# Patient Record
Sex: Male | Born: 1975 | Race: White | Hispanic: No | Marital: Married | Smoking: Never smoker
Health system: Southern US, Academic
[De-identification: ages and names within clinical notes are randomized; demographics above are authoritative.]

## PROBLEM LIST (undated history)

## (undated) DIAGNOSIS — Z8659 Personal history of other mental and behavioral disorders: Secondary | ICD-10-CM

## (undated) DIAGNOSIS — E079 Disorder of thyroid, unspecified: Secondary | ICD-10-CM

## (undated) DIAGNOSIS — F411 Generalized anxiety disorder: Secondary | ICD-10-CM

## (undated) DIAGNOSIS — G4700 Insomnia, unspecified: Secondary | ICD-10-CM

## (undated) DIAGNOSIS — G40909 Epilepsy, unspecified, not intractable, without status epilepticus: Secondary | ICD-10-CM

## (undated) HISTORY — PX: HX APPENDECTOMY: SHX54

## (undated) HISTORY — PX: HX TONSILLECTOMY: SHX27

## (undated) HISTORY — PX: HX ADENOIDECTOMY: SHX29

---
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
## 2021-09-03 ENCOUNTER — Other Ambulatory Visit: Payer: Self-pay

## 2021-09-03 ENCOUNTER — Encounter (HOSPITAL_BASED_OUTPATIENT_CLINIC_OR_DEPARTMENT_OTHER): Payer: Self-pay

## 2021-09-03 ENCOUNTER — Emergency Department
Admission: EM | Admit: 2021-09-03 | Discharge: 2021-09-03 | Disposition: A | Discharge status: Home / Self Care | Payer: 59 | Attending: Family | Admitting: Family

## 2021-09-03 DIAGNOSIS — B370 Candidal stomatitis: Secondary | ICD-10-CM

## 2021-09-03 DIAGNOSIS — B379 Candidiasis, unspecified: Secondary | ICD-10-CM

## 2021-09-03 HISTORY — DX: Epilepsy, unspecified, not intractable, without status epilepticus: G40.909

## 2021-09-03 HISTORY — DX: Personal history of other mental and behavioral disorders: Z86.59

## 2021-09-03 MED ORDER — NYSTATIN 100,000 UNIT/ML ORAL SUSPENSION
5.0000 mL | Freq: Four times a day (QID) | ORAL | 0 refills | Status: AC
Start: 2021-09-03 — End: 2021-09-13

## 2021-09-03 MED ORDER — FLUCONAZOLE 100 MG TABLET
200.0000 mg | ORAL_TABLET | ORAL | Status: AC
Start: 2021-09-03 — End: 2021-09-03
  Administered 2021-09-03: 200 mg via ORAL

## 2021-09-03 MED ORDER — NYSTATIN 100,000 UNIT/ML ORAL SUSPENSION
ORAL | Status: AC
Start: 2021-09-03 — End: 2021-09-03
  Filled 2021-09-03: qty 5

## 2021-09-03 MED ORDER — NYSTATIN 100,000 UNIT/ML ORAL SUSPENSION
5.0000 mL | Freq: Four times a day (QID) | ORAL | Status: DC
Start: 2021-09-03 — End: 2021-09-03
  Administered 2021-09-03: 5 mL via ORAL

## 2021-09-03 MED ORDER — FLUCONAZOLE 100 MG TABLET
ORAL_TABLET | ORAL | Status: AC
Start: 2021-09-03 — End: 2021-09-03
  Filled 2021-09-03: qty 2

## 2021-09-03 NOTE — ED Provider Notes (Signed)
[facility], [facility] Emergency Department  ED Primary Provider Note  History of Present Illness   Chief Complaint   Patient presents with   . Mouth Pain     Arrival: The patient arrived by Car    Russell Silva is a 46 y.o. male who had concerns including Mouth Pain. had bronchitis last week. Inhaler, steroid, antibiotics, now has thrush. Denies diff swallowing.     Review of Systems   Constitutional: No fever, chills or weakness   Skin: No rash or diaphoresis  HENT: No headaches, or congestion, oral white patches.   Eyes: No vision changes or photophobia   Cardio: No chest pain, palpitations or leg swelling   Respiratory: No cough, wheezing or SOB  GI:  No nausea, vomiting or stool changes  GU:  No dysuria, hematuria, or increased frequency  MSK: No muscle aches, joint or back pain  Neuro: No seizures, LOC, numbness, tingling, or focal weakness  Psychiatric: No depression, SI or substance abuse  All other systems reviewed and are negative.    Historical Data   History Reviewed This Encounter: all noted and reviewed.     Physical Exam   ED Triage Vitals [09/03/21 1711]   BP (Non-Invasive) (!) 143/95   Heart Rate 79   Respiratory Rate 16   Temp    SpO2 99 %   Weight 104 kg (230 lb)   Height 1.727 m (5\' 8" )       Constitutional:  46 y.o. male who appears in no distress. Normal color, no cyanosis.   HENT:   Head: Normocephalic and atraumatic.   Mouth/Throat: Oropharynx is clear and moist.  White patches. pharyns tongue.   Eyes: EOMI, PERRL   Neck: Trachea midline. Neck supple.  Cardiovascular: RRR, No murmurs, rubs or gallops. Intact distal pulses.  Pulmonary/Chest: BS equal bilaterally. No respiratory distress. No wheezes, rales or chest tenderness.   Abdominal: Bowel sounds present and normal. Abdomen soft, no tenderness, no rebound and no guarding.  Back: No midline spinal tenderness, no paraspinal tenderness, no CVA tenderness.           Musculoskeletal: No edema, tenderness or  deformity.  Skin: warm and dry. No rash, erythema, pallor or cyanosis  Psychiatric: normal mood and affect. Behavior is normal.   Neurological: Patient keenly alert and responsive, easily able to raise eyebrows, facial muscles/expressions symmetric, speaking in fluent sentences, moving all extremities equally and fully, normal gait  Patient Data   Labs Ordered/Reviewed - No data to display  No orders to display     Medical Decision Making        MDM     Thrush. Leukoplakia. strep  Medications Administered in the ED   fluconazole (DIFLUCAN) tablet (has no administration in time range)   nystatin (MYCOSTATIN) 100,000 units per mL oral liquid (has no administration in time range)     Clinical Impression   Oral candidiasis (Primary)       Disposition: Discharged

## 2021-09-03 NOTE — ED Triage Notes (Signed)
Patient presents with pain and white spots throughout mouth. x7 days. Following use of inhalers.

## 2022-08-02 ENCOUNTER — Encounter (HOSPITAL_BASED_OUTPATIENT_CLINIC_OR_DEPARTMENT_OTHER): Payer: Self-pay

## 2022-08-02 ENCOUNTER — Other Ambulatory Visit: Payer: Self-pay

## 2022-08-02 ENCOUNTER — Emergency Department
Admission: EM | Admit: 2022-08-02 | Discharge: 2022-08-02 | Disposition: A | Discharge status: Home / Self Care | Payer: Commercial Managed Care - PPO | Attending: Emergency Medicine | Admitting: Emergency Medicine

## 2022-08-02 DIAGNOSIS — K068 Other specified disorders of gingiva and edentulous alveolar ridge: Secondary | ICD-10-CM | POA: Insufficient documentation

## 2022-08-02 DIAGNOSIS — K08409 Partial loss of teeth, unspecified cause, unspecified class: Secondary | ICD-10-CM

## 2022-08-02 DIAGNOSIS — Z98818 Other dental procedure status: Secondary | ICD-10-CM | POA: Insufficient documentation

## 2022-08-02 MED ORDER — LIDOCAINE 20 MG/ML (2 %)-EPINEPHRINE 1:100,000 INJECTION SOLUTION
INTRAMUSCULAR | Status: AC
Start: 2022-08-02 — End: 2022-08-02
  Filled 2022-08-02: qty 30

## 2022-08-02 MED ORDER — TRANEXAMIC ACID TOPICAL SOLUTION
10.0000 mL | CUTANEOUS | Status: AC
Start: 2022-08-02 — End: 2022-08-02
  Administered 2022-08-02: 10 mL via TOPICAL

## 2022-08-02 MED ORDER — LIDOCAINE 20 MG/ML (2 %)-EPINEPHRINE 1:100,000 INJECTION SOLUTION
1.0000 mL | Freq: Once | INTRAMUSCULAR | Status: AC
Start: 2022-08-02 — End: 2022-08-02
  Administered 2022-08-02: 20 mg via INTRADERMAL

## 2022-08-02 MED ORDER — TRANEXAMIC ACID 1,000 MG/10 ML (100 MG/ML) INTRAVENOUS SOLUTION
INTRAVENOUS | Status: AC
Start: 2022-08-02 — End: 2022-08-02
  Filled 2022-08-02: qty 10

## 2022-08-02 MED ORDER — LIDOCAINE 2 %-EPINEPHRINE BITARTRATE 1:100,000 INJECTION CARTRIDGE
0.3000 mL | CARTRIDGE | Freq: Once | INTRAMUSCULAR | Status: DC
Start: 2022-08-02 — End: 2022-08-02

## 2022-08-02 NOTE — ED Triage Notes (Signed)
Pt had 4 teeth extracted yesterday, bleeding gums, nasal congestion

## 2022-08-02 NOTE — ED Provider Notes (Signed)
[facility], [facility] Emergency Department  ED Primary Provider Note  HPI:  Russell Silva is a 47 y.o. male     Patient complains of bleeding from gums.  He had 4 teeth extracted yesterday.  He was initially hemostatic after but he has had slow and persistent bleeding since.  No difficulty swallowing no trouble breathing.  He denies trauma.  No fevers no chills no nausea no vomiting.  He has not been able to reach his a dentist.  COVID vaccinated.  Full code.  ROS review and negative aside from stated in HPI.    Physical Exam:  ED Triage Vitals [08/02/22 0257]   BP (Non-Invasive) 124/83   Heart Rate 83   Respiratory Rate 17   Temperature 36.3 C (97.3 F)   SpO2 98 %   Weight 99.8 kg (220 lb)   Height 1.727 m (5' 8"$ )     No acute distress.  Patient awake alert oriented x3.  Mood is appropriate.  Pupils 3 mm equal round reactive.  Extraocular movements are intact.  Oropharynx is bloody.  He was controlling secretions.  Phonating appropriately.  He was a dentate.  Reading from site where tooth number 24 twenty-five used to reside.  There is no swelling.    Trachea midline.  Neck is supple.  Heart has regular rate and rhythm without significant murmurs rubs or gallops.  Lungs are clear to auscultation.  Abdomen soft nontender, nondistended.  Moving all extremities without difficulty.  No rash no edema.      Patient data:  Labs Ordered/Reviewed - No data to display  No orders to display     Regional Nerve Block    Date/Time: 08/02/2022 4:02 AM    Performed by: Eustace Quail, MD  Authorized by: Eustace Quail, MD    Consent:     Consent obtained:  Verbal    Consent given by:  Patient    Risks, benefits, and alternatives were discussed: yes      Risks discussed:  Infection, intravenous injection, bleeding, allergic reaction, nerve damage and pain    Alternatives discussed:  No treatment  Universal protocol:     Patient identity confirmed:  Verbally with patient and arm  band  Indications:     Indications:  Pain relief  Location:     Body area:  Head (Bilateral submental)    Laterality:  Bilateral  Pre-procedure details:     Preparation: Patient was prepped and draped in usual sterile fashion    Procedure details:     Block needle gauge:  25 G    Anesthetic injected:  Lidocaine 2% WITH epi    Injection procedure:  Anatomic landmarks identified  Post-procedure details:     Dressing:  None    Procedure completion:  Tolerated  Comments:      Patient received bilateral submental nerve blocks to achieve sufficient analgesia.      MDM:  Bleeding from gumline following dental extraction.  There is no signs of active infection.  Patient is protecting airway.  Bleeding was controlled with a combination of direct injection lidocaine with epi and direct pressure with TXA soaked gauze.  Patient tolerated procedure well.  He feels better on re-evaluation.  I have asked him to follow closely with his dentist.  I provided anticipatory guidance and strict return to ED instructions in both a verbal and written manner.  He was comfortable with discharge.        Discharged  Clinical Impression   S/P tooth extraction   Bleeding gums (Primary)     Medications Administered in the ED   tranexamic acid (CYKLOKAPRON) 100 mg/mL topical solution (10 mL Apply Topically Given 08/02/22 0353)   lidocaine 2%-EPINEPHrine 1:100,000 injection (20 mg Intradermal Given 08/02/22 0335)        Current Discharge Medication List        CONTINUE these medications - NO CHANGES were made during your visit.        Details   calcium citrate-vitamin D3 200 mg-6.25 mcg (250 unit) Tablet  Commonly known as: CITRACAL   Oral, DAILY  Refills: 0     levETIRAcetam 1,000 mg Tablet  Commonly known as: KEPPRA   1,000 mg, Oral, 2 TIMES DAILY  Refills: 0     LORazepam 0.5 mg Tablet  Commonly known as: ATIVAN   0.5 mg, Oral, DAILY PRN  Refills: 0     Lovastatin 10 mg Tablet  Commonly known as: MEVACOR   10 mg, Oral, EVERY EVENING, Take with  evening meal  Refills: 0     pantoprazole 20 mg Tablet, Delayed Release (E.C.)  Commonly known as: PROTONIX   20 mg, Oral, EVERY MORNING BEFORE BREAKFAST  Refills: 0     traZODone 50 mg Tablet  Commonly known as: DESYREL   50 mg, Oral, NIGHTLY  Refills: 0     vitamin B complex Tablet   1 Tablet, Oral, DAILY  Refills: 0

## 2022-08-02 NOTE — Discharge Instructions (Signed)
Please follow up with your dentist tomorrow.  If you have her rebleed please applied direct pressure with a single finger for 30 minutes.  If bleeding continues return to emergency department.  Return to emergency department if you have worsening pain swelling or any other concerning symptoms.  Thank you for visiting [facility]

## 2022-08-02 NOTE — ED Nurses Note (Addendum)
Hemostasis to left lower gum at this time,. MD at bedside.

## 2022-08-02 NOTE — ED Nurses Note (Signed)
Patient discharged home with family.  AVS reviewed with patient/care giver.  A written copy of the AVS and discharge instructions was given to the patient/care giver.  Questions sufficiently answered as needed.  Patient/care giver encouraged to follow up with PCP as indicated.  In the event of an emergency, patient/care giver instructed to call 911 or go to the nearest emergency room.

## 2022-10-12 IMAGING — MR MRI CERVICAL SPINE WITHOUT CONTRAST
4 of 5 series · 24 of 48 positions shown · IV contrast (gadolinium)
Comparison: None available.

﻿EXAM:  86474   MRI CERVICAL SPINE WITHOUT CONTRAST
INDICATION: 46-year-old sustained trauma at work one week ago.  Pain in the left arm.
TECHNIQUE: Multiplanar multisequential MRI of the cervical spine was performed without gadolinium contrast.

[Series 5: T2 · sagittal · 3.0mm · 0.75mm/px · 8 of 13 slices shown (1 of 2)]
[im 1/13]
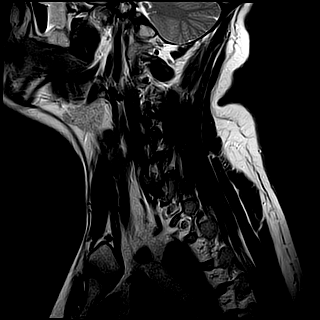
[im 2/13]
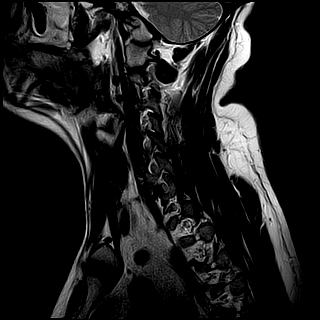
[im 4/13]
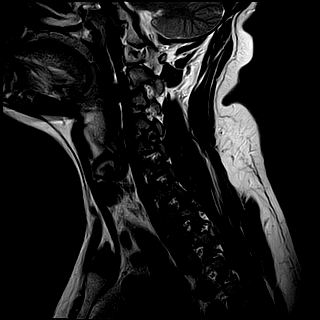
[im 6/13]
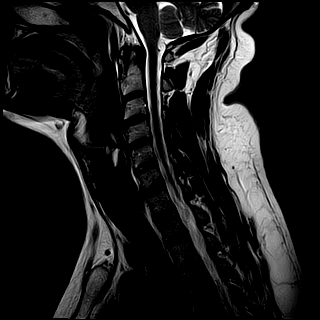
[im 7/13]
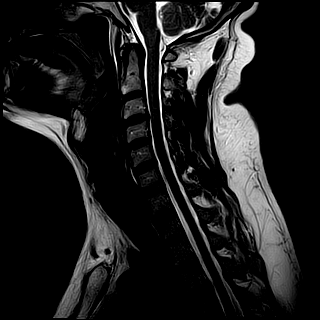
[im 9/13]
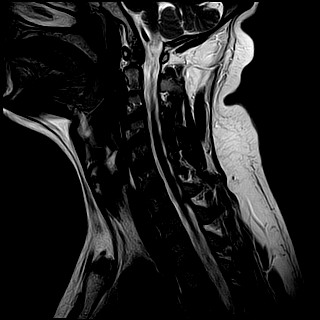
[im 11/13]
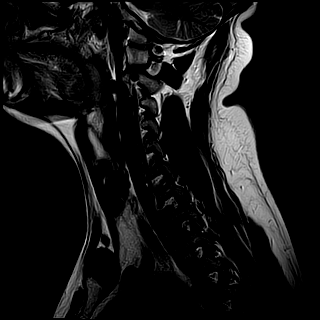
[im 13/13]
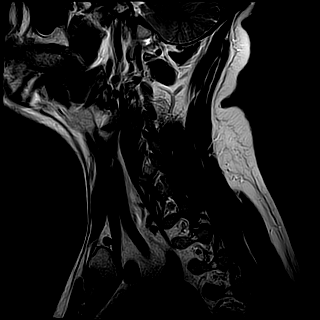

[Series 6: T1 · sagittal · 3.0mm · 0.47mm/px · 4 of 13 slices shown]
[im 1/13]
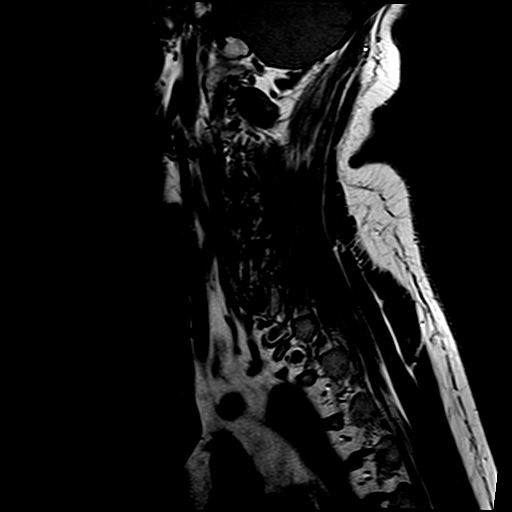
[im 2/13]
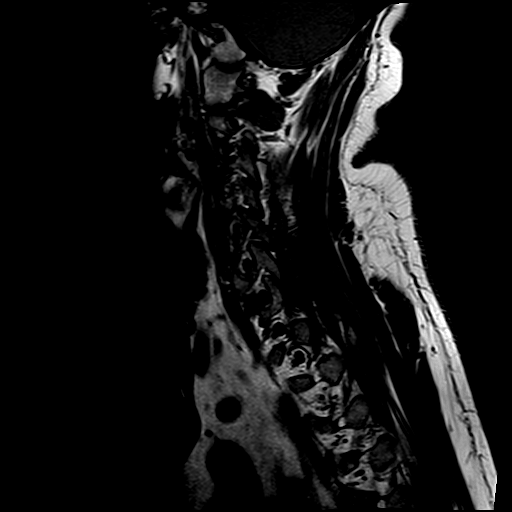
[im 7/13]
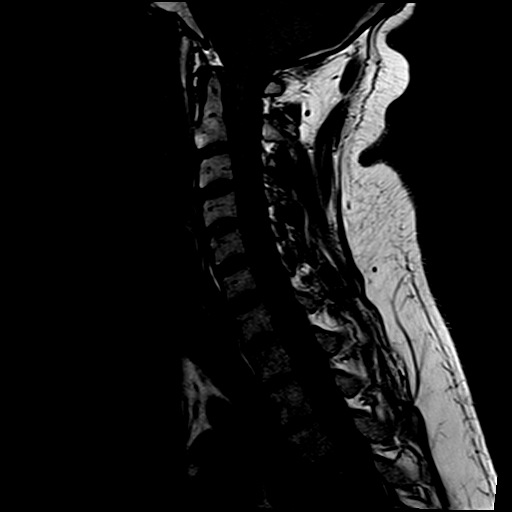
[im 11/13]
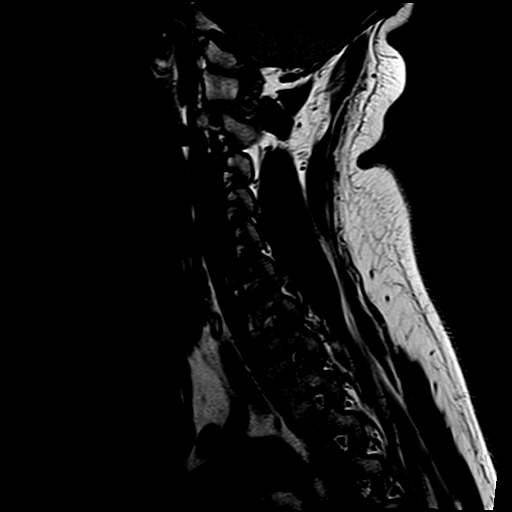

[Series 7: STIR · sagittal · 3.0mm · 0.47mm/px · 3 of 13 slices shown]
[im 2/13]
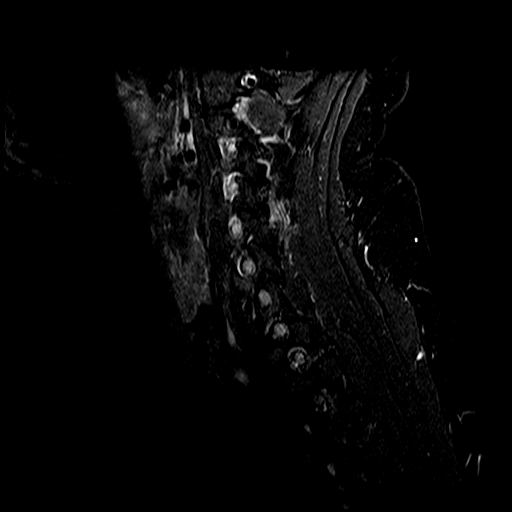
[im 7/13]
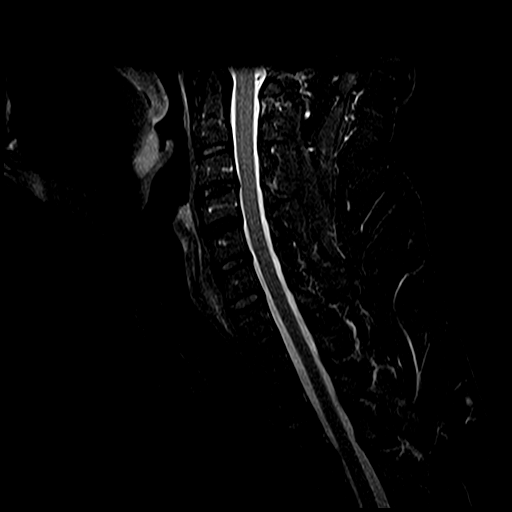
[im 11/13]
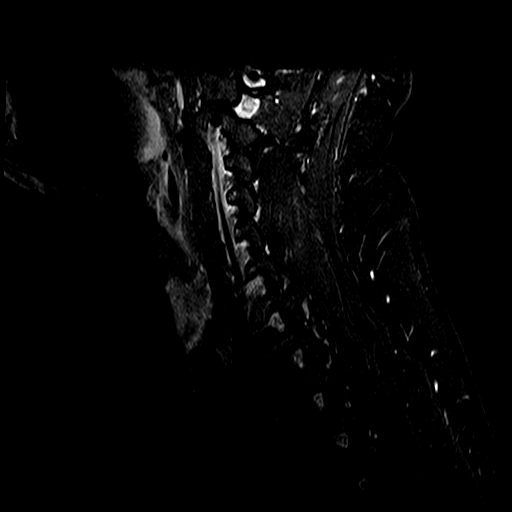

[Series 8: T2 · oblique · 3.0mm · 0.39mm/px · 9 of 18 slices shown (2 of 2)]
[im 1/18]
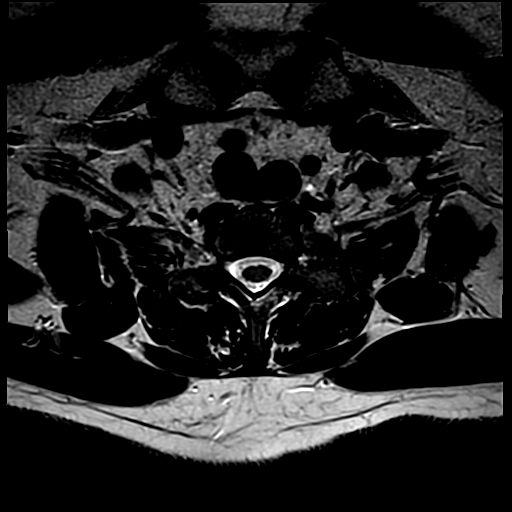
[im 4/18]
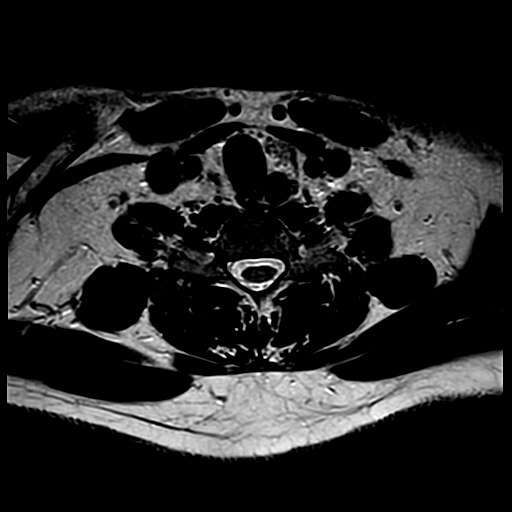
[im 5/18]
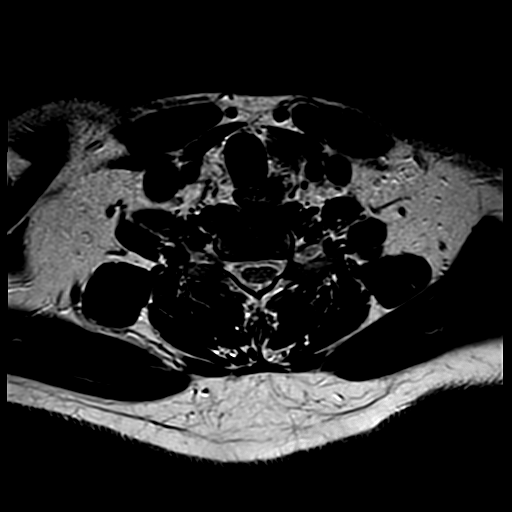
[im 8/18]
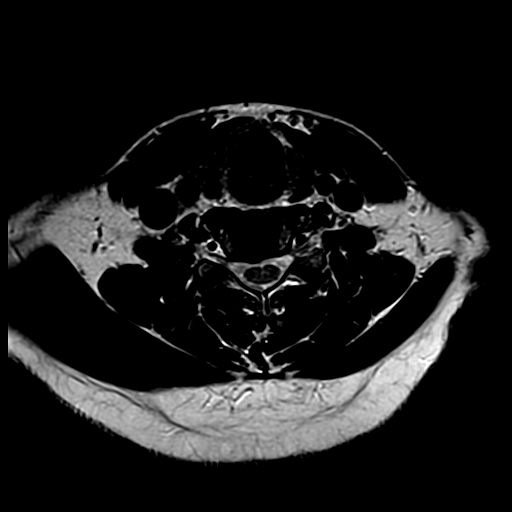
[im 10/18]
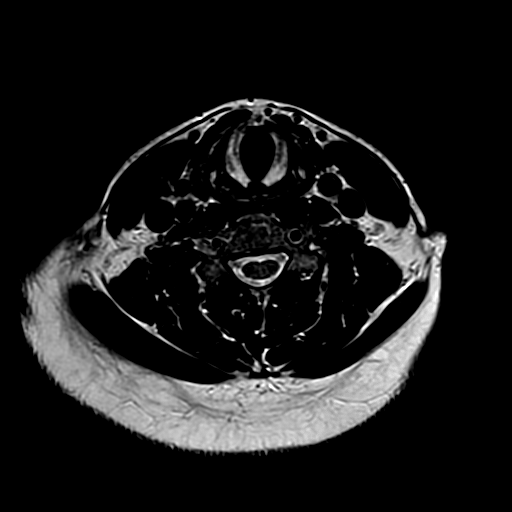
[im 13/18]
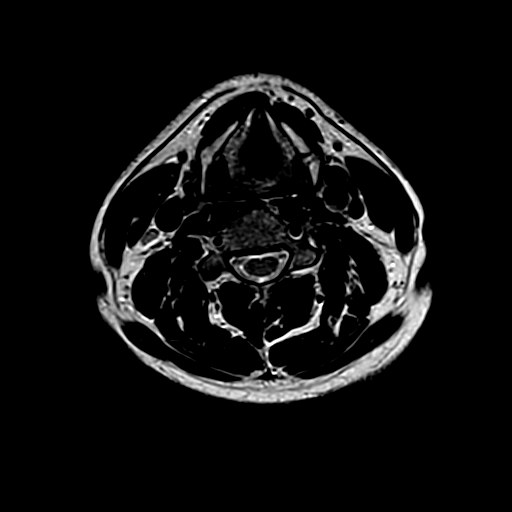
[im 14/18]
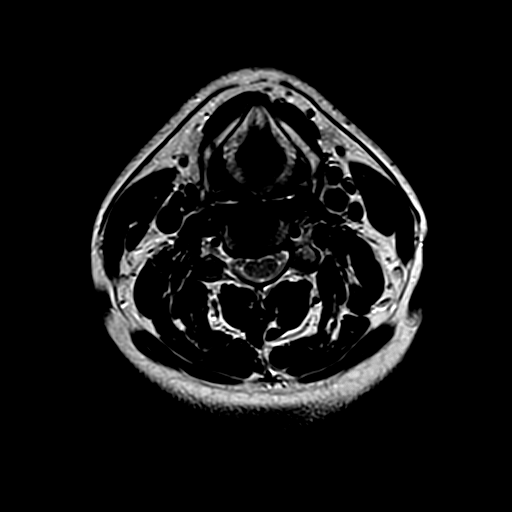
[im 16/18]
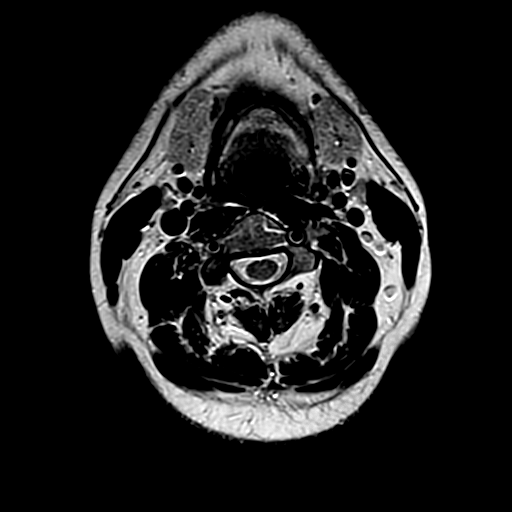
[im 18/18]
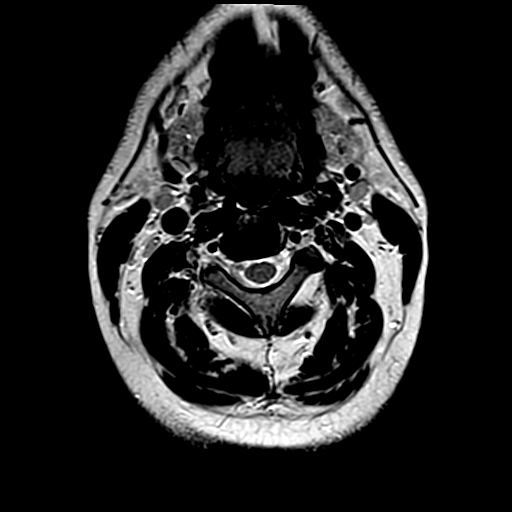

[24 of 48 positions shown; findings below may reference images not displayed]

FINDINGS: No acute bony lesions of cervical vertebrae are seen.  Minimal compression deformity of upper endplate of T1 vertebral body is noted likely representing minimal old compression fracture.  

No significant thoracic disc herniation or central spinal stenosis are noted.  Mild degenerative disc disease at C3-4 level with minimal bulging of annulus in the midline.  

No evidence of neural foraminal compromise in the cervical region.  Cervical spinal cord shows no focal lesions.  Paravertebral soft tissues are unremarkable.
IMPRESSION: 1. No acute bony lesions of cervical vertebrae.  Minimal old compression deformity of upper endplate of T1 vertebra.

2. Mild degenerative changes at C3-4 disc.  No evidence of cervical disc herniation, spinal stenosis or foraminal stenosis is seen.  

3. Paravertebral soft tissues are unremarkable.

4. Above mentioned findings are chronic in nature.

## 2023-10-19 ENCOUNTER — Emergency Department (HOSPITAL_BASED_OUTPATIENT_CLINIC_OR_DEPARTMENT_OTHER)

## 2023-10-19 ENCOUNTER — Encounter (HOSPITAL_BASED_OUTPATIENT_CLINIC_OR_DEPARTMENT_OTHER): Payer: Self-pay

## 2023-10-19 ENCOUNTER — Emergency Department
Admission: EM | Admit: 2023-10-19 | Discharge: 2023-10-19 | Disposition: A | Discharge status: Home / Self Care | Attending: PHYSICIAN ASSISTANT | Admitting: PHYSICIAN ASSISTANT

## 2023-10-19 ENCOUNTER — Other Ambulatory Visit: Payer: Self-pay

## 2023-10-19 DIAGNOSIS — R079 Chest pain, unspecified: Secondary | ICD-10-CM

## 2023-10-19 DIAGNOSIS — R0789 Other chest pain: Secondary | ICD-10-CM | POA: Insufficient documentation

## 2023-10-19 HISTORY — DX: Insomnia, unspecified: G47.00

## 2023-10-19 LAB — CBC WITH DIFF
BASOPHIL #: 0.03 10*3/uL (ref 0.00–0.10)
BASOPHIL %: 0 % (ref 0–1)
EOSINOPHIL #: 0.25 10*3/uL (ref 0.00–0.50)
EOSINOPHIL %: 4 % (ref 1–8)
HCT: 43.8 % (ref 36.7–47.1)
HGB: 14.8 g/dL (ref 12.5–16.3)
LYMPHOCYTE #: 1.75 10*3/uL (ref 1.00–3.20)
LYMPHOCYTE %: 28 % (ref 15–43)
MCH: 30 pg (ref 23.8–33.4)
MCHC: 33.7 g/dL (ref 32.5–36.3)
MCV: 89 fL (ref 73.0–96.2)
MONOCYTE #: 0.56 10*3/uL (ref 0.30–1.10)
MONOCYTE %: 9 % (ref 6–14)
MPV: 8.3 fL (ref 7.4–11.4)
NEUTROPHIL #: 3.65 10*3/uL (ref 1.70–7.60)
NEUTROPHIL %: 59 % (ref 44–74)
PLATELETS: 233 10*3/uL (ref 140–440)
RBC: 4.92 10*6/uL (ref 4.06–5.63)
RDW: 15.7 % (ref 12.1–16.2)
WBC: 6.2 10*3/uL (ref 3.6–10.2)

## 2023-10-19 LAB — COMPREHENSIVE METABOLIC PANEL, NON-FASTING
ALBUMIN/GLOBULIN RATIO: 1.3 (ref 0.8–1.4)
ALBUMIN: 4 g/dL (ref 3.4–5.0)
ALKALINE PHOSPHATASE: 88 U/L (ref 46–116)
ALT (SGPT): 29 U/L (ref <=78)
ANION GAP: 9 mmol/L (ref 4–13)
AST (SGOT): 15 U/L (ref 15–37)
BILIRUBIN TOTAL: 0.4 mg/dL (ref 0.2–1.0)
BUN/CREA RATIO: 16
BUN: 19 mg/dL — ABNORMAL HIGH (ref 7–18)
CALCIUM, CORRECTED: 9.2 mg/dL
CALCIUM: 9.2 mg/dL (ref 8.5–10.1)
CHLORIDE: 105 mmol/L (ref 98–107)
CO2 TOTAL: 28 mmol/L (ref 21–32)
CREATININE: 1.16 mg/dL (ref 0.70–1.30)
ESTIMATED GFR: 79 mL/min/{1.73_m2} (ref >59)
GLOBULIN: 3
GLUCOSE: 112 mg/dL — ABNORMAL HIGH (ref 74–106)
OSMOLALITY, CALCULATED: 286 mosm/kg (ref 270–290)
POTASSIUM: 4.5 mmol/L (ref 3.5–5.1)
PROTEIN TOTAL: 7 g/dL (ref 6.4–8.2)
SODIUM: 142 mmol/L (ref 136–145)

## 2023-10-19 LAB — TROPONIN-I
TROPONIN I: <2 ng/L (ref <20)
TROPONIN I: <2 ng/L (ref <20)
TROPONIN I: <2 ng/L (ref <20)

## 2023-10-19 LAB — PT/INR
INR: 1.51 — ABNORMAL HIGH (ref 0.84–1.10)
PROTHROMBIN TIME: 17.1 s — ABNORMAL HIGH (ref 9.8–12.7)

## 2023-10-19 LAB — PTT (PARTIAL THROMBOPLASTIN TIME): APTT: 33.9 s (ref 25.0–38.0)

## 2023-10-19 MED ORDER — ASPIRIN 81 MG CHEWABLE TABLET
324.0000 mg | CHEWABLE_TABLET | ORAL | Status: AC
Start: 2023-10-19 — End: 2023-10-19
  Administered 2023-10-19: 324 mg via ORAL

## 2023-10-19 MED ORDER — ASPIRIN 81 MG CHEWABLE TABLET
CHEWABLE_TABLET | ORAL | Status: AC
Start: 2023-10-19 — End: 2023-10-19
  Filled 2023-10-19: qty 4

## 2023-10-19 NOTE — ED Nurses Note (Signed)
 Visitor at bed side  stated has no chest pain   "didn't know anxiety could be that bad"

## 2023-10-19 NOTE — ED Provider Notes (Signed)
 Emergency Department  [facility], [facility] Emergency Room  10/19/2023     Russell Silva  01-27-1976  48 y.o.  male  [place] [address]   [phone] (home)  PCP: Michaeleen Adler, FNP   Date of service:10/19/2023 12:43    Chief Complaint:   Chief Complaint   Patient presents with    Chest Pain      Pt complains of chest pain onset 30 minutes ago with tingling in the left arm. Describes the pain as constant pressure. Also complains of being dizzy and lightheaded. States he also fell when he started having chest pain but did not hit his head.           HPI: Patient is a 48 y.o. male who presents to the emergency department for chest pain.  Patient reports he was arguing with his family and developed anterior chest pain.  Pain is currently resolved.  Patient arrived by EMS.  Refused aspirin  prior to arrival.  Patient reports history of chest pain with stress in the past.  Denies any shortness of breath.  No fever.  No recent URI symptoms.  Patient reports he has stress test previously while admitted to [facility] for chest pain.            Past Medical History:   Past Medical History:   Diagnosis Date    Epilepsy     History of depression     Insomnia      (Not in an outpatient encounter)     Past Surgical History:   Past Surgical History:   Procedure Laterality Date    Hx adenoidectomy      Hx tonsillectomy         Social History:   Social History     Tobacco Use    Smoking status: Never    Smokeless tobacco: Never   Substance Use Topics    Alcohol use: Never    Drug use: Never        Family History:  No family history on file.        Medications Prior to Admission       Prescriptions    calcium citrate-vitamin D3 (CITRACAL) 200 mg-6.25 mcg (250 unit) Oral Tablet    Take by mouth Once a day    levETIRAcetam (KEPPRA) 1,000 mg Oral Tablet    Take 1 Tablet (1,000 mg total) by mouth Twice daily    LORazepam (ATIVAN) 0.5 mg Oral Tablet    Take 1 Tablet (0.5 mg total) by mouth Once per day as needed for Anxiety     Lovastatin (MEVACOR) 10 mg Oral Tablet    Take 1 Tablet (10 mg total) by mouth Every evening Take with evening meal    pantoprazole (PROTONIX) 20 mg Oral Tablet, Delayed Release (E.C.)    Take 1 Tablet (20 mg total) by mouth Every morning before breakfast    traZODone (DESYREL) 50 mg Oral Tablet    Take 1 Tablet (50 mg total) by mouth Every night    vitamin B complex Oral Tablet    Take 1 Tablet by mouth Once a day            Allergies:   Allergies   Allergen Reactions    Penicillins Hives/ Urticaria       Above history reviewed with patient.  Allergies, medication list, reviewed.        Physical exam:  Physical Exam  Body mass index is 35.24 kg/m.  ED Triage Vitals [  10/19/23 1214]   BP (Non-Invasive) 136/87   Heart Rate 69   Respiratory Rate 17   Temperature 36.6 C (97.9 F)   SpO2 94 %   Weight 102 kg (225 lb)   Height 1.702 m (5\' 7" )       Constitutional: patient is oriented to person, place, and time and well-developed, well-nourished, and in no distress.  Tearful  HENT:   Head: Normocephalic and atraumatic.   Right Ear: External ear normal.   Left Ear: External ear normal.   Nose: Nose normal.   Mouth/Throat: Oropharynx is clear and moist.   Eyes: Pupils are equal, round, and reactive to light. Conjunctivae and EOM are normal.   Neck: Normal range of motion. Neck supple.   Cardiovascular: Normal rate, regular rhythm, normal heart sounds and intact distal pulses.   Pulmonary/Chest: Effort normal and breath sounds normal.   Abdominal: Soft. Bowel sounds are normal.   Musculoskeletal: Normal range of motion.   Neurological:Patient is alert and oriented to person, place, and time.  Gait normal. GCS score is 15.   Skin: Skin is warm and dry.   Psychiatric:  Sad and tearful.  Judgment normal.  Memory normal      The following orders were placed after examining the patient :  Orders Placed This Encounter    XR AP MOBILE CHEST    CBC/DIFF    COMPREHENSIVE METABOLIC PANEL, NON-FASTING    PT/INR    PTT (PARTIAL  THROMBOPLASTIN TIME)    TROPONIN-I NOW    TROPONIN-I IN THREE HOURS    TROPONIN-I IN ONE HOUR    CBC WITH DIFF    ECG 12 LEAD    aspirin  chewable tablet 324 mg      XR AP MOBILE CHEST   Final Result   NO ACUTE FINDINGS.            Radiologist location ID: [id number]            Results for orders placed or performed during the hospital encounter of 10/19/23 (from the past 12 hours)   COMPREHENSIVE METABOLIC PANEL, NON-FASTING   Result Value Ref Range    SODIUM 142 136 - 145 mmol/L    POTASSIUM 4.5 3.5 - 5.1 mmol/L    CHLORIDE 105 98 - 107 mmol/L    CO2 TOTAL 28 21 - 32 mmol/L    ANION GAP 9 4 - 13 mmol/L    BUN 19 (H) 7 - 18 mg/dL    CREATININE 8.41 3.24 - 1.30 mg/dL    BUN/CREA RATIO 16     ESTIMATED GFR 79 >59 mL/min/1.51m^2    ALBUMIN 4.0 3.4 - 5.0 g/dL    CALCIUM 9.2 8.5 - 40.1 mg/dL    GLUCOSE 027 (H) 74 - 106 mg/dL    ALKALINE PHOSPHATASE 88 46 - 116 U/L    ALT (SGPT) 29 <=78 U/L    AST (SGOT) 15 15 - 37 U/L    BILIRUBIN TOTAL 0.4 0.2 - 1.0 mg/dL    PROTEIN TOTAL 7.0 6.4 - 8.2 g/dL    ALBUMIN/GLOBULIN RATIO 1.3 0.8 - 1.4    OSMOLALITY, CALCULATED 286 270 - 290 mOsm/kg    CALCIUM, CORRECTED 9.2 mg/dL    GLOBULIN 3.0    PT/INR   Result Value Ref Range    PROTHROMBIN TIME 17.1 (H) 9.8 - 12.7 seconds    INR 1.51 (H) 0.84 - 1.10   PTT (PARTIAL THROMBOPLASTIN TIME)   Result Value Ref Range  APTT 33.9 25.0 - 38.0 seconds   TROPONIN-I NOW   Result Value Ref Range    TROPONIN I <2 <20 ng/L   CBC WITH DIFF   Result Value Ref Range    WBC 6.2 3.6 - 10.2 x10^3/uL    RBC 4.92 4.06 - 5.63 x10^6/uL    HGB 14.8 12.5 - 16.3 g/dL    HCT 53.6 64.4 - 03.4 %    MCV 89.0 73.0 - 96.2 fL    MCH 30.0 23.8 - 33.4 pg    MCHC 33.7 32.5 - 36.3 g/dL    RDW 74.2 59.5 - 63.8 %    PLATELETS 233 140 - 440 x10^3/uL    MPV 8.3 7.4 - 11.4 fL    NEUTROPHIL % 59 44 - 74 %    LYMPHOCYTE % 28 15 - 43 %    MONOCYTE % 9 6 - 14 %    EOSINOPHIL % 4 1 - 8 %    BASOPHIL % 0 0 - 1 %    NEUTROPHIL # 3.65 1.70 - 7.60 x10^3/uL    LYMPHOCYTE # 1.75 1.00 -  3.20 x10^3/uL    MONOCYTE # 0.56 0.30 - 1.10 x10^3/uL    EOSINOPHIL # 0.25 0.00 - 0.50 x10^3/uL    BASOPHIL # 0.03 0.00 - 0.10 x10^3/uL   TROPONIN-I IN ONE HOUR   Result Value Ref Range    TROPONIN I <2 <20 ng/L   TROPONIN-I IN THREE HOURS   Result Value Ref Range    TROPONIN I <2 <20 ng/L         ED Course:   ED Course as of 10/19/23 1650   Sat Oct 19, 2023   1325 CBC/DIFF  Unremarkable   1325 COMPREHENSIVE METABOLIC PANEL, NON-FASTING(!)  Unremarkable   1325 TROPONIN-I NOW   1325 INR(!): 1.51   1325 XR AP MOBILE CHEST  NO ACUTE FINDINGS.   1440 TROPONIN-I: <2   1610 TROPONIN-I: <2      Medications Ordered/Administered in the ED   aspirin  chewable tablet 324 mg (324 mg Oral Given 10/19/23 1221)               Emergency Department Procedure:    EKG Interpertation:  Normal sinus rhythm.  Rate 66.   PR interval 162.  No STEMI    Procedures    Medical Decision Making  Patient is a 48 y.o. male who presents to the emergency department for chest pain.  Patient reports he was arguing with his family and developed anterior chest pain.  Pain is currently resolved.  Patient arrived by EMS.  Refused aspirin  prior to arrival.  Patient reports history of chest pain with stress in the past.  Denies any shortness of breath.  No fever.  No recent URI symptoms.  Patient reports he has stress test previously while admitted to [facility] for chest pain.  Physical exam patient is tearful.  Labs were unremarkable.  Serial troponins completed and all negative.  Patient continues to be chest pain-free.  Chest x-ray negative.  EKG was normal sinus rhythm.  Recommend further outpatient testing.  Based on heart score patient is low risk.    Amount and/or Complexity of Data Reviewed  Labs: ordered.  Radiology: ordered. Decision-making details documented in ED Course.  ECG/medicine tests: ordered and independent interpretation performed.    Risk  OTC drugs.            Findings and diagnosis discussed with patient.  Clinical Impression:   Clinical  Impression   Chest pain, unspecified type - resolved (Primary)         Disposition: Discharged          No follow-ups on file.   Discharge Medication List as of 10/19/2023  4:11 PM         Michaeleen Adler, FNP  [address]  [place] [address]  [phone]    In 3 days        Future Appointments  No future appointments.     BP 127/77   Pulse 56   Temp 36.6 C (97.8 F)   Resp 16   Ht 1.702 m (5\' 7" )   Wt 102 kg (225 lb)   SpO2 97%   BMI 35.24 kg/m        Buddie Carina, PA-C  10/19/2023              This note was partially created using voice recognition software and is inherently subject to errors including those of syntax and "sound alike " substitutions which may escape proof reading.  In such instances, original meaning may be extrapolated by contextual derivation.

## 2023-10-19 NOTE — Discharge Instructions (Signed)
*  Follow up with PCP  *Return to ER sooner as needed

## 2023-10-20 DIAGNOSIS — R079 Chest pain, unspecified: Secondary | ICD-10-CM

## 2023-10-20 LAB — ECG 12 LEAD
Atrial Rate: 66 {beats}/min
Calculated P Axis: 17 degrees
Calculated R Axis: -25 degrees
Calculated T Axis: 3 degrees
PR Interval: 162 ms
QRS Duration: 102 ms
QT Interval: 426 ms
QTC Calculation: 446 ms
Ventricular rate: 66 {beats}/min

## 2023-10-21 ENCOUNTER — Emergency Department (HOSPITAL_BASED_OUTPATIENT_CLINIC_OR_DEPARTMENT_OTHER)

## 2023-10-21 ENCOUNTER — Emergency Department
Admission: EM | Admit: 2023-10-21 | Discharge: 2023-10-21 | Disposition: A | Discharge status: Home / Self Care | Source: Home / Self Care | Attending: Emergency Medicine | Admitting: Emergency Medicine

## 2023-10-21 ENCOUNTER — Encounter (HOSPITAL_BASED_OUTPATIENT_CLINIC_OR_DEPARTMENT_OTHER): Payer: Self-pay

## 2023-10-21 ENCOUNTER — Other Ambulatory Visit: Payer: Self-pay

## 2023-10-21 DIAGNOSIS — X509XXA Other and unspecified overexertion or strenuous movements or postures, initial encounter: Secondary | ICD-10-CM | POA: Insufficient documentation

## 2023-10-21 DIAGNOSIS — S86911A Strain of unspecified muscle(s) and tendon(s) at lower leg level, right leg, initial encounter: Secondary | ICD-10-CM

## 2023-10-21 DIAGNOSIS — S83419A Sprain of medial collateral ligament of unspecified knee, initial encounter: Secondary | ICD-10-CM

## 2023-10-21 DIAGNOSIS — S83411A Sprain of medial collateral ligament of right knee, initial encounter: Secondary | ICD-10-CM | POA: Insufficient documentation

## 2023-10-21 MED ORDER — IBUPROFEN 800 MG TABLET
800.0000 mg | ORAL_TABLET | ORAL | Status: AC
Start: 2023-10-21 — End: 2023-10-21
  Administered 2023-10-21: 800 mg via ORAL

## 2023-10-21 MED ORDER — IBUPROFEN 800 MG TABLET
ORAL_TABLET | ORAL | Status: AC
Start: 2023-10-21 — End: 2023-10-21
  Filled 2023-10-21: qty 1

## 2023-10-21 NOTE — ED Provider Notes (Signed)
 [facility] [place] - Emergency Department  ED Primary Provider Note  History of Present Illness   Chief Complaint   Patient presents with    Knee Pain     Patient was running and having right knee pain ongoing for about a week and now pain is worse.     Russell Silva is a 48 y.o. male who had concerns including Knee Pain.  Arrival: The patient arrived by Car complaining of right knee pain after practicing and running to get shape for PT. 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  Felt knee pain a week ago in his constantly been running on it doing stairs and steps.  He states the pain now is much worse.  Patient denies any numbness or tingling in the extremity.  No weakness.    HPI  Review of Systems   Review of Systems   Constitutional:  Positive for activity change and appetite change. Negative for chills and fever.   HENT:  Negative for ear pain and sore throat.    Eyes:  Negative for pain and visual disturbance.   Respiratory:  Negative for cough and shortness of breath.    Cardiovascular:  Negative for chest pain and palpitations.   Gastrointestinal:  Negative for abdominal pain and vomiting.   Genitourinary:  Negative for dysuria and hematuria.   Musculoskeletal:  Positive for arthralgias, gait problem, joint swelling and myalgias. Negative for back pain.   Skin:  Negative for color change and rash.   Neurological:  Negative for seizures and syncope.   All other systems reviewed and are negative.     Historical Data   History Reviewed This Encounter:     Physical Exam   ED Triage Vitals   BP (Non-Invasive) 10/21/23 1723 117/80   Heart Rate 10/21/23 1723 94   Respiratory Rate 10/21/23 1723 18   Temp --    SpO2 10/21/23 1723 98 %   Weight 10/21/23 1722 102 kg (225 lb)   Height 10/21/23 1722 1.727 m (5\' 8" )     Physical Exam  Vitals and nursing note reviewed.   Constitutional:       General: He is not in acute distress.     Appearance: Normal  appearance. He is well-developed and normal weight.   HENT:      Head: Normocephalic and atraumatic.      Right Ear: External ear normal.      Left Ear: External ear normal.      Nose: Nose normal.      Mouth/Throat:      Mouth: Mucous membranes are dry.   Eyes:      Extraocular Movements: Extraocular movements intact.      Conjunctiva/sclera: Conjunctivae normal.      Pupils: Pupils are equal, round, and reactive to light.   Cardiovascular:      Rate and Rhythm: Normal rate and regular rhythm.      Pulses: Normal pulses.      Heart sounds: Normal heart sounds. No murmur heard.  Pulmonary:      Effort: Pulmonary effort is normal. No respiratory distress.      Breath sounds: Normal breath sounds.   Abdominal:      General: Bowel sounds are normal.      Palpations: Abdomen is soft.      Tenderness: There is no abdominal tenderness.   Musculoskeletal:         General: Swelling and  tenderness present.      Cervical back: Normal range of motion and neck supple.      Comments: Positive tenderness over the medial collateral ligament with tenderness over the medial meniscus area.  Minimal swelling to the knee area.  No crepitus or deformity.   Skin:     General: Skin is warm and dry.      Capillary Refill: Capillary refill takes less than 2 seconds.   Neurological:      General: No focal deficit present.      Mental Status: He is alert and oriented to person, place, and time.   Psychiatric:         Mood and Affect: Mood normal.         Behavior: Behavior normal.         Thought Content: Thought content normal.       Patient Data   Labs Ordered/Reviewed - No data to display  XR KNEE RIGHT 4 OR MORE VIEW   Final Result by Edi, Radresults In (04/28 1758)   NEGATIVE KNEE SERIES                Radiologist location ID: [id number]           Medical Decision Making        Medical Decision Making  Patient is 48 year old white male complaining running on his right knee after experiencing pain about a week ago.  He states the pain  is much worse now.  Patient thinks he injured it further with pain to the medial aspect of the knee including the meniscal area.  Patient has been running up and down steps.  Patient denies any recent fall or injury.  He denies lifting anything heavy recently.  No numbness or tingling in the extremity.  No weakness.  Patient will have an x-ray of his right knee.  He will then be treated for results and discharged home.  Patient will follow up with his primary care physician in the next 2-3 days.    Amount and/or Complexity of Data Reviewed  Radiology: ordered.    Risk  Prescription drug management.             Medications Ordered/Administered in the ED   ibuprofen  (MOTRIN ) tablet (has no administration in time range)     Clinical Impression   Knee strain, right, initial encounter (Primary)   Sprain of medial collateral ligament of knee       Disposition: Discharged               Clinical Impression   Knee strain, right, initial encounter (Primary)   Sprain of medial collateral ligament of knee       Current Discharge Medication List

## 2023-10-21 NOTE — ED Nurses Note (Signed)

## 2023-10-21 NOTE — ED Nurses Note (Signed)
 Ace wrap applied to right knee per provider order. Neurovascular intact, cap refill <2 seconds. Patient provided with crutches per request. Education provided, verbalized understanding.

## 2024-03-17 ENCOUNTER — Encounter (HOSPITAL_BASED_OUTPATIENT_CLINIC_OR_DEPARTMENT_OTHER): Payer: Self-pay

## 2024-03-17 ENCOUNTER — Other Ambulatory Visit: Payer: Self-pay

## 2024-03-17 ENCOUNTER — Emergency Department
Admission: EM | Admit: 2024-03-17 | Discharge: 2024-03-17 | Disposition: A | Discharge status: Home / Self Care | Attending: Emergency Medicine | Admitting: Emergency Medicine

## 2024-03-17 DIAGNOSIS — F321 Major depressive disorder, single episode, moderate: Secondary | ICD-10-CM | POA: Insufficient documentation

## 2024-03-17 LAB — CBC WITH DIFF
BASOPHIL #: 0.04 x10ˆ3/uL (ref 0.00–0.10)
BASOPHIL %: 0 % (ref 0–1)
EOSINOPHIL #: 0.13 x10ˆ3/uL (ref 0.00–0.60)
EOSINOPHIL %: 1 % (ref 1–8)
HCT: 46.5 % (ref 36.7–47.1)
HGB: 15.9 g/dL (ref 12.5–16.3)
LYMPHOCYTE #: 0.95 x10ˆ3/uL — ABNORMAL LOW (ref 1.00–3.00)
LYMPHOCYTE %: 8 % — ABNORMAL LOW (ref 15–43)
MCH: 30.2 pg (ref 23.8–33.4)
MCHC: 34.3 g/dL (ref 32.5–36.3)
MCV: 88.1 fL (ref 73.0–96.2)
MONOCYTE #: 0.83 x10ˆ3/uL (ref 0.30–1.00)
MONOCYTE %: 7 % (ref 6–14)
MPV: 9 fL (ref 7.4–11.4)
NEUTROPHIL #: 9.29 x10ˆ3/uL — ABNORMAL HIGH (ref 1.85–7.84)
NEUTROPHIL %: 83 % — ABNORMAL HIGH (ref 44–74)
PLATELETS: 254 x10ˆ3/uL (ref 140–440)
RBC: 5.28 x10ˆ6/uL (ref 4.06–5.63)
RDW: 16.1 % (ref 12.1–16.2)
WBC: 11.2 x10ˆ3/uL — ABNORMAL HIGH (ref 3.6–10.2)

## 2024-03-17 LAB — COMPREHENSIVE METABOLIC PANEL, NON-FASTING
ALBUMIN/GLOBULIN RATIO: 1.2 (ref 0.8–1.4)
ALBUMIN: 4 g/dL (ref 3.4–5.0)
ALKALINE PHOSPHATASE: 96 U/L (ref 46–116)
ALT (SGPT): 22 U/L (ref <=78)
ANION GAP: 11 mmol/L (ref 4–13)
AST (SGOT): 12 U/L — ABNORMAL LOW (ref 15–37)
BILIRUBIN TOTAL: 0.4 mg/dL (ref 0.2–1.0)
BUN/CREA RATIO: 18
BUN: 18 mg/dL (ref 7–18)
CALCIUM, CORRECTED: 9.3 mg/dL
CALCIUM: 9.3 mg/dL (ref 8.5–10.1)
CHLORIDE: 103 mmol/L (ref 98–107)
CO2 TOTAL: 23 mmol/L (ref 21–32)
CREATININE: 0.99 mg/dL (ref 0.70–1.30)
ESTIMATED GFR: 95 mL/min/1.73mˆ2 (ref >59)
GLOBULIN: 3.3
GLUCOSE: 132 mg/dL — ABNORMAL HIGH (ref 74–106)
OSMOLALITY, CALCULATED: 278 mosm/kg (ref 270–290)
POTASSIUM: 4.1 mmol/L (ref 3.5–5.1)
PROTEIN TOTAL: 7.3 g/dL (ref 6.4–8.2)
SODIUM: 137 mmol/L (ref 136–145)

## 2024-03-17 LAB — DRUG SCREEN, NO CONFIRMATION, URINE
AMPHETAMINES URINE: NEGATIVE
BARBITURATES URINE: NEGATIVE
BENZODIAZEPINES URINE: NEGATIVE
CANNABINOIDS URINE: NEGATIVE
COCAINE METABOLITES URINE: NEGATIVE
METHADONE URINE: NEGATIVE
OPIATES URINE: NEGATIVE
PCP URINE: NEGATIVE

## 2024-03-17 LAB — URINALYSIS, MACRO/MICRO
BILIRUBIN: NEGATIVE mg/dL
BLOOD: NEGATIVE mg/dL
GLUCOSE: NEGATIVE mg/dL
KETONES: NEGATIVE mg/dL
LEUKOCYTES: NEGATIVE WBCs/uL
NITRITE: NEGATIVE
PH: 6 (ref 4.6–8.0)
PROTEIN: NEGATIVE mg/dL
SPECIFIC GRAVITY: 1.015 (ref 1.003–1.035)
UROBILINOGEN: 0.2 mg/dL (ref 0.2–1.0)

## 2024-03-17 LAB — ACETAMINOPHEN LEVEL: ACETAMINOPHEN LEVEL: 0 ug/mL — ABNORMAL LOW (ref 10.0–30.0)

## 2024-03-17 LAB — THYROID STIMULATING HORMONE (SENSITIVE TSH): TSH: 2.304 u[IU]/mL (ref 0.358–3.740)

## 2024-03-17 LAB — SALICYLATE LEVEL: SALICYLATE LEVEL: <3 mg/dL (ref 0–20)

## 2024-03-17 LAB — ETHANOL, SERUM/PLASMA: ETHANOL: <3 mg/dL (ref <=3)

## 2024-03-17 MED ORDER — IBUPROFEN 800 MG TABLET
ORAL_TABLET | ORAL | Status: AC
Start: 2024-03-17 — End: 2024-03-17
  Filled 2024-03-17: qty 1

## 2024-03-17 MED ORDER — IBUPROFEN 800 MG TABLET
800.0000 mg | ORAL_TABLET | ORAL | Status: AC
Start: 2024-03-17 — End: 2024-03-17
  Administered 2024-03-17: 800 mg via ORAL

## 2024-03-17 NOTE — ED Provider Notes (Addendum)
 [facility], [facility] - Emergency Department  ED Primary Provider Note  History of Present Illness   Chief Complaint   Patient presents with    Psychiatric Problem     Per pt pt wife is leaving him he is emotional and crying,  and he has a pressure headache     Russell Silva is a 48 y.o. male who had concerns including Psychiatric Problem.  Arrival: The patient arrived by ambulance complaining of feeling emotionally distraught after his wife told him this morning after waking him up from sleep that she was leaving.  Patient is very teary eyed and depressed presently.  Denies any suicidal thoughts or homicidal thoughts.  He states having a headache and feeling like he is got pressure on his chest.  Feels like he can not take a deep breath.  Patient denies any chest pain.  He denies using drugs or alcohol.  Patient does have a history of epilepsy as well as depression and insomnia.  He denies any smoking or alcohol use.    HPI  Review of Systems   Review of Systems   Constitutional:  Positive for activity change and appetite change. Negative for chills and fever.   HENT:  Negative for ear pain and sore throat.    Eyes:  Negative for pain and visual disturbance.   Respiratory:  Negative for cough and shortness of breath.    Cardiovascular:  Negative for chest pain and palpitations.   Gastrointestinal:  Negative for abdominal pain and vomiting.   Genitourinary:  Negative for dysuria and hematuria.   Musculoskeletal:  Negative for arthralgias and back pain.   Skin:  Negative for color change and rash.   Neurological:  Negative for seizures and syncope.   Psychiatric/Behavioral:  Positive for decreased concentration and sleep disturbance.    All other systems reviewed and are negative.     Historical Data   History Reviewed This Encounter:     Physical Exam   ED Triage Vitals [03/17/24 1402]   BP (Non-Invasive) (!) 115/93   Heart Rate 99   Respiratory Rate 16   Temperature 36.7 C (98 F)   SpO2 96 %    Weight 102 kg (225 lb)   Height 1.727 m (5' 8)     Physical Exam  Vitals and nursing note reviewed.   Constitutional:       General: He is not in acute distress.     Appearance: Normal appearance. He is well-developed. He is obese.   HENT:      Head: Normocephalic and atraumatic.      Right Ear: External ear normal.      Left Ear: External ear normal.      Nose: Nose normal.      Mouth/Throat:      Mouth: Mucous membranes are dry.   Eyes:      Extraocular Movements: Extraocular movements intact.      Conjunctiva/sclera: Conjunctivae normal.      Pupils: Pupils are equal, round, and reactive to light.   Cardiovascular:      Rate and Rhythm: Normal rate and regular rhythm.      Pulses: Normal pulses.      Heart sounds: Normal heart sounds. No murmur heard.  Pulmonary:      Effort: Pulmonary effort is normal. No respiratory distress.      Breath sounds: Normal breath sounds.   Abdominal:      General: Bowel sounds are normal.  Palpations: Abdomen is soft.      Tenderness: There is no abdominal tenderness.   Musculoskeletal:         General: No swelling. Normal range of motion.      Cervical back: Normal range of motion and neck supple.   Skin:     General: Skin is warm and dry.      Capillary Refill: Capillary refill takes less than 2 seconds.   Neurological:      General: No focal deficit present.      Mental Status: He is alert and oriented to person, place, and time.   Psychiatric:         Mood and Affect: Mood normal.         Behavior: Behavior normal.         Thought Content: Thought content normal.       Patient Data     Labs Ordered/Reviewed   COMPREHENSIVE METABOLIC PANEL, NON-FASTING - Abnormal; Notable for the following components:       Result Value    GLUCOSE 132 (*)     AST (SGOT) 12 (*)     All other components within normal limits    Narrative:     Estimated Glomerular Filtration Rate (eGFR) is calculated using the CKD-EPI (2021) equation, intended for patients 90 years of age and older. If gender  is not documented or unknown, there will be no eGFR calculation.   ACETAMINOPHEN  LEVEL - Abnormal; Notable for the following components:    ACETAMINOPHEN  LEVEL 0.0 (*)     All other components within normal limits   CBC WITH DIFF - Abnormal; Notable for the following components:    WBC 11.2 (*)     NEUTROPHIL % 83 (*)     LYMPHOCYTE % 8 (*)     NEUTROPHIL # 9.29 (*)     LYMPHOCYTE # 0.95 (*)     All other components within normal limits   ETHANOL, SERUM/PLASMA - Normal   DRUG SCREEN, NO CONFIRMATION, URINE - Normal   SALICYLATE LEVEL - Normal   THYROID  STIMULATING HORMONE (SENSITIVE TSH) - Normal   URINALYSIS, MACRO/MICRO - Normal   CBC/DIFF    Narrative:     The following orders were created for panel order CBC/DIFF.  Procedure                               Abnormality         Status                     ---------                               -----------         ------                     CBC WITH IPQQ[[id number]]                Abnormal            Final result                 Please view results for these tests on the individual orders.   URINALYSIS WITH REFLEX MICROSCOPIC AND CULTURE IF POSITIVE    Narrative:     The following orders were created for panel order URINALYSIS WITH REFLEX  MICROSCOPIC AND CULTURE IF POSITIVE.  Procedure                               Abnormality         Status                     ---------                               -----------         ------                     URINALYSIS, MACRO/MICRO[[id number]]      Normal              Final result                 Please view results for these tests on the individual orders.       No orders to display     Medical Decision Making        Medical Decision Making  Patient is 48 year old white male complaining his wife waking him up this morning from sleep and telling him that she was leaving.  Patient does have a history of depression in the past.  States feeling depressed presently but no suicidal thoughts.  He denies any hallucinations auditory or  visual.  Denies using any drugs or alcohol.  Patient also has a history of insomnia.  Patient will have labs as well as a urinalysis done.  Patient will then talk to the [facility] once labs are back and he is medically cleared.  Verlee will decide whether or not patient can end up with inpatient therapy or outpatient therapy.        Patient spoke to the [facility] they denied him for inpatient admission.  Explained to him that he could go for outpatient therapy.  Patient refuses at this time.    Amount and/or Complexity of Data Reviewed  Labs: ordered.  ECG/medicine tests: ordered.     Details: Normal sinus rhythm 86, PR interval 170 MS, QT interval 358 MS, otherwise normal EKG.    Risk  Prescription drug management.             Medications Ordered/Administered in the ED   ibuprofen  (MOTRIN ) tablet (800 mg Oral Given 03/17/24 1534)     Clinical Impression   Moderate major depression (CMS HCC) (Primary)       Disposition: Discharged               Clinical Impression   Moderate major depression (CMS HCC) (Primary)       Current Discharge Medication List

## 2024-03-17 NOTE — ED Nurses Note (Signed)
 Crisis hotline numbers given. Verbal and written instructions given. Pt and wife voices understanding. . DC home ambulatory.

## 2024-03-17 NOTE — ED Nurses Note (Signed)
 Pt awake alert states, I am not suicidal . I'm just sad. I have to work. I can not be admitted anywhere. I need to leave. I can follow up with someone as an out patient  I work at [employer] , I will not go to [employer].  Dr. Eilene informed.

## 2024-03-17 NOTE — ED Nurses Note (Signed)
 Pt talking with the [facility].

## 2024-03-18 DIAGNOSIS — I498 Other specified cardiac arrhythmias: Secondary | ICD-10-CM

## 2024-03-18 LAB — ECG 12 LEAD
Atrial Rate: 86 {beats}/min
Calculated P Axis: 8 degrees
Calculated R Axis: -27 degrees
Calculated T Axis: 21 degrees
PR Interval: 170 ms
QRS Duration: 94 ms
QT Interval: 358 ms
QTC Calculation: 428 ms
Ventricular rate: 86 {beats}/min

## 2024-03-25 ENCOUNTER — Other Ambulatory Visit: Payer: Self-pay

## 2024-03-25 ENCOUNTER — Emergency Department
Admission: EM | Admit: 2024-03-25 | Discharge: 2024-03-25 | Disposition: A | Discharge status: Home / Self Care | Payer: Auto Insurance (includes no fault) | Attending: Family | Admitting: Family

## 2024-03-25 ENCOUNTER — Emergency Department (HOSPITAL_BASED_OUTPATIENT_CLINIC_OR_DEPARTMENT_OTHER): Payer: Auto Insurance (includes no fault)

## 2024-03-25 ENCOUNTER — Encounter (HOSPITAL_BASED_OUTPATIENT_CLINIC_OR_DEPARTMENT_OTHER): Payer: Self-pay

## 2024-03-25 DIAGNOSIS — S0990XA Unspecified injury of head, initial encounter: Secondary | ICD-10-CM

## 2024-03-25 DIAGNOSIS — S161XXA Strain of muscle, fascia and tendon at neck level, initial encounter: Secondary | ICD-10-CM

## 2024-03-25 DIAGNOSIS — S199XXA Unspecified injury of neck, initial encounter: Secondary | ICD-10-CM

## 2024-03-25 DIAGNOSIS — Y92410 Unspecified street and highway as the place of occurrence of the external cause: Secondary | ICD-10-CM | POA: Insufficient documentation

## 2024-03-25 DIAGNOSIS — I10 Essential (primary) hypertension: Secondary | ICD-10-CM

## 2024-03-25 LAB — DRUG SCREEN, NO CONFIRMATION, URINE
AMPHETAMINES URINE: NEGATIVE
BARBITURATES URINE: NEGATIVE
BENZODIAZEPINES URINE: NEGATIVE
CANNABINOIDS URINE: NEGATIVE
COCAINE METABOLITES URINE: NEGATIVE
METHADONE URINE: NEGATIVE
OPIATES URINE: NEGATIVE
PCP URINE: NEGATIVE

## 2024-03-25 MED ORDER — METHOCARBAMOL 750 MG TABLET
1500.0000 mg | ORAL_TABLET | Freq: Once | ORAL | Status: AC
Start: 2024-03-25 — End: 2024-03-25
  Administered 2024-03-25: 1500 mg via ORAL

## 2024-03-25 MED ORDER — NAPROXEN 500 MG TABLET
500.0000 mg | ORAL_TABLET | Freq: Two times a day (BID) | ORAL | 0 refills | Status: AC
Start: 2024-03-25 — End: ?

## 2024-03-25 MED ORDER — CYCLOBENZAPRINE 10 MG TABLET
10.0000 mg | ORAL_TABLET | Freq: Three times a day (TID) | ORAL | 0 refills | Status: AC | PRN
Start: 2024-03-25 — End: ?

## 2024-03-25 MED ORDER — METHOCARBAMOL 750 MG TABLET
ORAL_TABLET | ORAL | Status: AC
Start: 2024-03-25 — End: 2024-03-25
  Filled 2024-03-25: qty 2

## 2024-03-25 NOTE — ED Nurses Note (Signed)
 Patient discharged home with family.  AVS reviewed with patient/care giver.  A written copy of the AVS and discharge instructions was given to the patient/care giver. Scripts handed to patient/care giver. Questions sufficiently answered as needed.  Patient/care giver encouraged to follow up with PCP as indicated.  In the event of an emergency, patient/care giver instructed to call 911 or go to the nearest emergency room.
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
## 2024-03-25 NOTE — ED Nurses Note (Signed)
 Patient requested a drug screen for his work. Physician notified, orders placed

## 2024-04-23 ENCOUNTER — Emergency Department (HOSPITAL_BASED_OUTPATIENT_CLINIC_OR_DEPARTMENT_OTHER)

## 2024-04-23 ENCOUNTER — Encounter (HOSPITAL_BASED_OUTPATIENT_CLINIC_OR_DEPARTMENT_OTHER): Payer: Self-pay

## 2024-04-23 ENCOUNTER — Emergency Department
Admission: EM | Admit: 2024-04-23 | Discharge: 2024-04-23 | Disposition: A | Discharge status: Home / Self Care | Attending: Emergency Medicine | Admitting: Emergency Medicine

## 2024-04-23 ENCOUNTER — Other Ambulatory Visit: Payer: Self-pay

## 2024-04-23 DIAGNOSIS — R112 Nausea with vomiting, unspecified: Secondary | ICD-10-CM

## 2024-04-23 DIAGNOSIS — K529 Noninfective gastroenteritis and colitis, unspecified: Secondary | ICD-10-CM

## 2024-04-23 LAB — COMPREHENSIVE METABOLIC PANEL, NON-FASTING
ALBUMIN/GLOBULIN RATIO: 1.2 (ref 0.8–1.4)
ALBUMIN: 3.3 g/dL — ABNORMAL LOW (ref 3.4–5.0)
ALKALINE PHOSPHATASE: 77 U/L (ref 46–116)
ALT (SGPT): 26 U/L (ref <=78)
ANION GAP: 9 mmol/L (ref 4–13)
AST (SGOT): 16 U/L (ref 15–37)
BILIRUBIN TOTAL: 0.2 mg/dL (ref 0.2–1.0)
BUN/CREA RATIO: 12
BUN: 11 mg/dL (ref 7–18)
CALCIUM, CORRECTED: 8.8 mg/dL
CALCIUM: 8.2 mg/dL — ABNORMAL LOW (ref 8.5–10.1)
CHLORIDE: 110 mmol/L — ABNORMAL HIGH (ref 98–107)
CO2 TOTAL: 26 mmol/L (ref 21–32)
CREATININE: 0.89 mg/dL (ref 0.70–1.30)
ESTIMATED GFR: 107 mL/min/1.73mˆ2 (ref >59)
GLOBULIN: 2.8
GLUCOSE: 93 mg/dL (ref 74–106)
OSMOLALITY, CALCULATED: 288 mosm/kg (ref 270–290)
POTASSIUM: 3.9 mmol/L (ref 3.5–5.1)
PROTEIN TOTAL: 6.1 g/dL — ABNORMAL LOW (ref 6.4–8.2)
SODIUM: 145 mmol/L (ref 136–145)

## 2024-04-23 LAB — CBC WITH DIFF
BASOPHIL #: 0.02 x10ˆ3/uL (ref 0.00–0.10)
BASOPHIL %: 1 % (ref 0–1)
EOSINOPHIL #: 0.13 x10ˆ3/uL (ref 0.00–0.60)
EOSINOPHIL %: 3 % (ref 1–8)
HCT: 37.9 % (ref 36.7–47.1)
HGB: 13 g/dL (ref 12.5–16.3)
LYMPHOCYTE #: 0.95 x10ˆ3/uL — ABNORMAL LOW (ref 1.00–3.00)
LYMPHOCYTE %: 23 % (ref 15–43)
MCH: 30.2 pg (ref 23.8–33.4)
MCHC: 34.2 g/dL (ref 32.5–36.3)
MCV: 88.3 fL (ref 73.0–96.2)
MONOCYTE #: 0.39 x10ˆ3/uL (ref 0.30–1.00)
MONOCYTE %: 9 % (ref 6–14)
MPV: 8.4 fL (ref 7.4–11.4)
NEUTROPHIL #: 2.66 x10ˆ3/uL (ref 1.85–7.84)
NEUTROPHIL %: 64 % (ref 44–74)
PLATELETS: 265 x10ˆ3/uL (ref 140–440)
RBC: 4.29 x10ˆ6/uL (ref 4.06–5.63)
RDW: 13.7 % (ref 12.1–16.2)
WBC: 4.2 x10ˆ3/uL (ref 3.6–10.2)

## 2024-04-23 LAB — URINALYSIS, MACRO/MICRO
BILIRUBIN: NEGATIVE mg/dL
BLOOD: NEGATIVE mg/dL
GLUCOSE: NEGATIVE mg/dL
KETONES: NEGATIVE mg/dL
LEUKOCYTES: NEGATIVE WBCs/uL
NITRITE: NEGATIVE
PH: 7 (ref 4.6–8.0)
PROTEIN: NEGATIVE mg/dL
SPECIFIC GRAVITY: 1.01 (ref 1.003–1.035)
UROBILINOGEN: 0.2 mg/dL (ref 0.2–1.0)

## 2024-04-23 LAB — LIPASE: LIPASE: 26 U/L (ref 15–77)

## 2024-04-23 LAB — MAGNESIUM: MAGNESIUM: 1.9 mg/dL (ref 1.8–2.4)

## 2024-04-23 MED ORDER — PANTOPRAZOLE 40 MG INTRAVENOUS SOLUTION
INTRAVENOUS | Status: AC
Start: 2024-04-23 — End: 2024-04-23
  Filled 2024-04-23: qty 10

## 2024-04-23 MED ORDER — DEXTROSE 5% IN WATER (D5W) FLUSH BAG - 250 ML
INTRAVENOUS | Status: DC | PRN
Start: 2024-04-23 — End: 2024-04-23

## 2024-04-23 MED ORDER — DICYCLOMINE 20 MG TABLET
20.0000 mg | ORAL_TABLET | Freq: Four times a day (QID) | ORAL | 0 refills | Status: AC
Start: 2024-04-23 — End: 2024-05-03

## 2024-04-23 MED ORDER — FAMOTIDINE 40 MG TABLET
40.0000 mg | ORAL_TABLET | Freq: Two times a day (BID) | ORAL | 0 refills | Status: AC
Start: 2024-04-23 — End: 2024-05-07

## 2024-04-23 MED ORDER — PANTOPRAZOLE 40 MG INTRAVENOUS SOLUTION
40.0000 mg | INTRAVENOUS | Status: AC
Start: 2024-04-23 — End: 2024-04-23
  Administered 2024-04-23: 40 mg via INTRAVENOUS

## 2024-04-23 MED ORDER — SODIUM CHLORIDE 0.9 % (FLUSH) INJECTION SYRINGE
3.0000 mL | INJECTION | INTRAMUSCULAR | Status: DC | PRN
Start: 2024-04-23 — End: 2024-04-23

## 2024-04-23 MED ORDER — ONDANSETRON HCL (PF) 4 MG/2 ML INJECTION SOLUTION
INTRAMUSCULAR | Status: AC
Start: 2024-04-23 — End: 2024-04-23
  Filled 2024-04-23: qty 2

## 2024-04-23 MED ORDER — SODIUM CHLORIDE 0.9 % (FLUSH) INJECTION SYRINGE
3.0000 mL | INJECTION | Freq: Three times a day (TID) | INTRAMUSCULAR | Status: DC
Start: 2024-04-23 — End: 2024-04-23

## 2024-04-23 MED ORDER — SODIUM CHLORIDE 0.9% FLUSH BAG - 250 ML
INTRAVENOUS | Status: DC | PRN
Start: 2024-04-23 — End: 2024-04-23

## 2024-04-23 MED ORDER — ONDANSETRON 4 MG DISINTEGRATING TABLET
4.0000 mg | ORAL_TABLET | Freq: Three times a day (TID) | ORAL | 0 refills | Status: AC | PRN
Start: 2024-04-23 — End: ?

## 2024-04-23 MED ORDER — ONDANSETRON HCL (PF) 4 MG/2 ML INJECTION SOLUTION
4.0000 mg | INTRAMUSCULAR | Status: AC
Start: 2024-04-23 — End: 2024-04-23
  Administered 2024-04-23: 4 mg via INTRAVENOUS

## 2024-04-23 MED ORDER — SODIUM CHLORIDE 0.9 % IV BOLUS
1000.0000 mL | INJECTION | Status: AC
Start: 2024-04-23 — End: 2024-04-23
  Administered 2024-04-23: 1000 mL via INTRAVENOUS
  Administered 2024-04-23: 0 mL via INTRAVENOUS

## 2024-04-23 NOTE — ED Provider Notes (Signed)
 [facility], [place] - Emergency Department  ED Primary Note  History of Present Illness   Russell Silva is a 48 y.o. male who had concerns including Vomiting. Pt states nvd  multiple times. States umbilical cramping. Denies fever nor other co  Review of Systems   Constitutional: No fever, chills or weakness   Skin: No rash or diaphoresis  HENT: No headaches, or congestion  Eyes: No vision changes or photophobia   Cardio: No chest pain, palpitations or leg swelling   Respiratory: No cough, wheezing or SOB  GI:  + nausea, vomiting abd pain + stool changes  GU:  No dysuria, hematuria, or increased frequency  MSK: No muscle aches, joint or back pain  Neuro: No seizures, LOC, numbness, tingling, or focal weakness  Psychiatric: No depression, SI or substance abuse  All other systems reviewed and are negative.      Physical Exam   ED Triage Vitals [04/23/24 1802]   BP (Non-Invasive) 113/75   Heart Rate 73   Respiratory Rate 20   Temperature 36.4 C (97.6 F)   SpO2 95 %   Weight 90.7 kg (200 lb)   Height 1.778 m (5' 10)     Constitutional:  48 y.o. male who appears in no distress. Normal color, no cyanosis.   HENT:   Head: Normocephalic and atraumatic.   Mouth/Throat: Oropharynx is clear and dry.   Eyes: EOMI, PERRL   Neck: Trachea midline. Neck supple.  Cardiovascular: RRR, No murmurs, rubs or gallops. Intact distal pulses.  Pulmonary/Chest: BS equal bilaterally. No respiratory distress. No wheezes, rales or chest tenderness.   Abdominal: Bowel sounds present and normal. Abdomen soft, no tenderness, no rebound and no guarding.  Back: No midline spinal tenderness, no paraspinal tenderness, no CVA tenderness.           Musculoskeletal: No edema, tenderness or deformity.  Skin: warm and dry. No rash, erythema, pallor or cyanosis  Psychiatric: normal mood and affect. Behavior is normal.   Neurological: Patient keenly alert and responsive, easily able to raise eyebrows, facial muscles/expressions  symmetric, speaking in fluent sentences, moving all extremities equally and fully, normal gait  Patient Data   Labs Ordered/Reviewed   COMPREHENSIVE METABOLIC PANEL, NON-FASTING - Abnormal; Notable for the following components:       Result Value    CHLORIDE 110 (*)     ALBUMIN 3.3 (*)     CALCIUM 8.2 (*)     PROTEIN TOTAL 6.1 (*)     All other components within normal limits    Narrative:     Estimated Glomerular Filtration Rate (eGFR) is calculated using the CKD-EPI (2021) equation, intended for patients 42 years of age and older. If gender is not documented or unknown, there will be no eGFR calculation.   CBC WITH DIFF - Abnormal; Notable for the following components:    LYMPHOCYTE # 0.95 (*)     All other components within normal limits   LIPASE - Normal   MAGNESIUM - Normal   CBC/DIFF    Narrative:     The following orders were created for panel order CBC/DIFF.  Procedure                               Abnormality         Status                     ---------                               -----------         ------  CBC WITH DIFF[[id number]]                Abnormal            Final result                 Please view results for these tests on the individual orders.   URINALYSIS WITH REFLEX MICROSCOPIC AND CULTURE IF POSITIVE    Narrative:     The following orders were created for panel order URINALYSIS WITH REFLEX MICROSCOPIC AND CULTURE IF POSITIVE.  Procedure                               Abnormality         Status                     ---------                               -----------         ------                     URINALYSIS, MACRO/MICRO[[id number]]                          In process                   Please view results for these tests on the individual orders.   URINALYSIS, MACRO/MICRO     CT ABDOMEN PELVIS WO IV CONTRAST   Final Result by Edi, Radresults In (10/30 1900)   1. NO ACUTE FINDINGS AT THE ABDOMEN OR PELVIS   Subcentimeter lung nodules. These are similar to an earlier CT  scan from 08/31/2020, compatible with benign disease         Radiologist location ID: [id number]           Medical Decision Making   Diff dx of gastroenteritis. Viral syndrome dehydration   Medications Ordered/Administered in the ED   NS flush syringe (has no administration in time range)   NS flush syringe (has no administration in time range)   NS 250 mL flush bag (has no administration in time range)     And   D5W 250 mL flush bag (has no administration in time range)   NS bolus infusion 1,000 mL (1,000 mL Intravenous New Bag/New Syringe 04/23/24 1824)   ondansetron (ZOFRAN) 2 mg/mL injection (4 mg Intravenous Given 04/23/24 1824)   pantoprazole (PROTONIX) 4 mg/mL injection (40 mg Intravenous Given 04/23/24 1824)   Wbc 4.2 lipase 22 mag 1.9 neg ct No nvd i er. Pt felt better no co.   Clinical Impression   Gastroenteritis (Primary)       Disposition: Discharged

## 2024-04-23 NOTE — ED Nurses Note (Signed)
 Patient states nausea and vomiting have subsided. Provider notified.

## 2024-04-23 NOTE — ED Nurses Note (Signed)

## 2024-04-23 NOTE — Discharge Instructions (Signed)
 Return if any concerns.

## 2024-04-23 NOTE — ED Nurses Note (Signed)
 Pt  states, I have had diarrhea for two days, this evening I started vomiting. My stomach has a lot of pressure all over. My family has had a stomach virus. Now I have it.  Abdomen soft tender throughout. Non distended.

## 2024-05-10 ENCOUNTER — Encounter (HOSPITAL_BASED_OUTPATIENT_CLINIC_OR_DEPARTMENT_OTHER): Payer: Self-pay

## 2024-05-10 ENCOUNTER — Emergency Department
Admission: EM | Admit: 2024-05-10 | Discharge: 2024-05-10 | Disposition: A | Discharge status: Home / Self Care | Attending: NURSE PRACTITIONER | Admitting: NURSE PRACTITIONER

## 2024-05-10 ENCOUNTER — Other Ambulatory Visit: Payer: Self-pay

## 2024-05-10 DIAGNOSIS — B9789 Other viral agents as the cause of diseases classified elsewhere: Secondary | ICD-10-CM | POA: Insufficient documentation

## 2024-05-10 DIAGNOSIS — J069 Acute upper respiratory infection, unspecified: Secondary | ICD-10-CM | POA: Insufficient documentation

## 2024-05-10 HISTORY — DX: Generalized anxiety disorder: F41.1

## 2024-05-10 HISTORY — DX: Disorder of thyroid, unspecified: E07.9

## 2024-05-10 LAB — COVID-19, FLU A/B, RSV RAPID BY PCR
INFLUENZA VIRUS TYPE A: NOT DETECTED
INFLUENZA VIRUS TYPE B: NOT DETECTED
RESPIRATORY SYNCTIAL VIRUS (RSV): NOT DETECTED
SARS-CoV-2: NOT DETECTED

## 2024-05-10 MED ORDER — BENZONATATE 100 MG CAPSULE
ORAL_CAPSULE | ORAL | Status: AC
Start: 2024-05-10 — End: 2024-05-10
  Filled 2024-05-10: qty 2

## 2024-05-10 MED ORDER — GUAIFENESIN 100 MG/5 ML ORAL LIQUID
400.0000 mg | ORAL | Status: AC
Start: 2024-05-10 — End: 2024-05-10
  Administered 2024-05-10: 400 mg via ORAL

## 2024-05-10 MED ORDER — BENZONATATE 100 MG CAPSULE
200.0000 mg | ORAL_CAPSULE | ORAL | Status: AC
Start: 2024-05-10 — End: 2024-05-10
  Administered 2024-05-10: 200 mg via ORAL

## 2024-05-10 MED ORDER — GUAIFENESIN ER 600 MG TABLET, EXTENDED RELEASE 12 HR
600.0000 mg | EXTENDED_RELEASE_TABLET | Freq: Two times a day (BID) | ORAL | Status: AC
Start: 2024-05-10 — End: ?

## 2024-05-10 MED ORDER — DEXAMETHASONE SODIUM PHOSPHATE (PF) 10 MG/ML INJECTION SOLUTION
INTRAMUSCULAR | Status: AC
Start: 2024-05-10 — End: 2024-05-10
  Filled 2024-05-10: qty 1

## 2024-05-10 MED ORDER — ACETAMINOPHEN 325 MG TABLET
975.0000 mg | ORAL_TABLET | ORAL | Status: AC
Start: 2024-05-10 — End: 2024-05-10
  Administered 2024-05-10: 975 mg via ORAL

## 2024-05-10 MED ORDER — FLUTICASONE PROPIONATE 50 MCG/ACTUATION NASAL SPRAY,SUSPENSION
1.0000 | Freq: Every day | NASAL | 0 refills | Status: AC
Start: 2024-05-10 — End: 2024-05-24

## 2024-05-10 MED ORDER — ACETAMINOPHEN 325 MG TABLET
ORAL_TABLET | ORAL | Status: AC
Start: 2024-05-10 — End: 2024-05-10
  Filled 2024-05-10: qty 3

## 2024-05-10 MED ORDER — KETOROLAC 60 MG/2 ML INTRAMUSCULAR SOLUTION
60.0000 mg | INTRAMUSCULAR | Status: AC
Start: 2024-05-10 — End: 2024-05-10
  Administered 2024-05-10: 60 mg via INTRAMUSCULAR

## 2024-05-10 MED ORDER — METHYLPREDNISOLONE 4 MG TABLETS IN A DOSE PACK
ORAL_TABLET | ORAL | 0 refills | Status: AC
Start: 2024-05-10 — End: ?

## 2024-05-10 MED ORDER — DEXAMETHASONE SODIUM PHOSPHATE (PF) 10 MG/ML INJECTION SOLUTION
10.0000 mg | INTRAMUSCULAR | Status: AC
Start: 2024-05-10 — End: 2024-05-10
  Administered 2024-05-10: 10 mg via INTRAMUSCULAR

## 2024-05-10 MED ORDER — BENZONATATE 100 MG CAPSULE
100.0000 mg | ORAL_CAPSULE | Freq: Three times a day (TID) | ORAL | 0 refills | Status: AC | PRN
Start: 2024-05-10 — End: 2024-05-15

## 2024-05-10 MED ORDER — KETOROLAC 60 MG/2 ML INTRAMUSCULAR SOLUTION
INTRAMUSCULAR | Status: AC
Start: 2024-05-10 — End: 2024-05-10
  Filled 2024-05-10: qty 2

## 2024-05-10 MED ORDER — IBUPROFEN 600 MG TABLET
600.0000 mg | ORAL_TABLET | Freq: Four times a day (QID) | ORAL | 0 refills | Status: AC | PRN
Start: 2024-05-10 — End: ?

## 2024-05-10 MED ORDER — GUAIFENESIN 100 MG/5 ML ORAL LIQUID
ORAL | Status: AC
Start: 2024-05-10 — End: 2024-05-10
  Filled 2024-05-10: qty 20

## 2024-05-10 NOTE — ED Provider Notes (Signed)
 [facility], [place] - Emergency Department  ED Primary Note  History of Present Illness   Russell Silva is a 48 y.o. male who had concerns including Flu Like Symptoms.     CHIEF COMPLAINT:    Cough and fever    PATIENT SUMMARY:    The patient presented with cough and fever.    HISTORY OF PRESENT ILLNESS:    The patient reported that the symptoms started on Wednesday with a sore throat. By Thursday, the sore throat worsened, and the patient developed a dry cough that was persistent. On Friday, the symptoms worsened, and the patient began using medication, including albuterol, which helped with breathing. By Saturday, the patient was coughing up sputum described as chunky and white. The patient developed a fever on the day of the appointment, experienced chills, and noted a lack of appetite, though continued to eat. The patient reported watery eyes, sinus congestion, and pressure in the hands. A home COVID-19 test was taken and returned negative. The patient also mentioned dark urine during the illness, attributed to insufficient fluid intake.  Patient otherwise looks acutely well with no signs and symptoms of respiratory or cardiovascular distress noted.  Nontoxic appearing    REVIEW OF SYSTEMS:    Respiratory: Positive for persistent cough and difficulty breathing. Gastrointestinal: Positive for lack of appetite. Negative for abdominal pain or diarrhea.  Neurological: Negative for confusion or headache.  Constitutional: Positive for fever and chills.  EENT: Positive for sore throat, watery eyes, and sinus congestion. Negative for ear pain or vision changes.  Genitourinary: Positive for dark urine. Negative for pain during urination.    Past Medical History:   Past Medical History:   Diagnosis Date    Anxiety state     Epilepsy     History of depression     Insomnia     Thyroid  disease        Past Surgical History:   Past Surgical History:   Procedure Laterality Date    Hx adenoidectomy      Hx  appendectomy      Hx tonsillectomy         Social History: Social History[1]     Family History:  No family history on file.  Oncology History    No problem history exists.        Physical Exam   ED Triage Vitals [05/10/24 1931]   BP (Non-Invasive) (!) 143/75   Heart Rate (!) 115   Respiratory Rate 20   Temperature (!) 38.5 C (101.3 F)   SpO2 96 %   Weight 94.8 kg (209 lb)   Height 1.727 m (5' 8)     Physical Exam  Vitals and nursing note reviewed.   Constitutional:       Appearance: He is normal weight.   HENT:      Head: Normocephalic.      Right Ear: External ear normal.      Left Ear: External ear normal.      Nose: Congestion present.      Mouth/Throat:      Mouth: Mucous membranes are moist.      Pharynx: Posterior oropharyngeal erythema present. No oropharyngeal exudate.   Eyes:      Extraocular Movements: Extraocular movements intact.      Conjunctiva/sclera: Conjunctivae normal.      Pupils: Pupils are equal, round, and reactive to light.   Cardiovascular:      Rate and Rhythm: Normal rate and regular rhythm.  Pulses: Normal pulses.      Heart sounds: Normal heart sounds.   Pulmonary:      Effort: Pulmonary effort is normal.      Breath sounds: Normal breath sounds.   Abdominal:      General: Abdomen is flat. Bowel sounds are normal.   Musculoskeletal:         General: Normal range of motion.      Cervical back: Normal range of motion.   Skin:     General: Skin is warm.      Capillary Refill: Capillary refill takes less than 2 seconds.   Neurological:      General: No focal deficit present.      Mental Status: He is alert and oriented to person, place, and time. Mental status is at baseline.   Psychiatric:         Mood and Affect: Mood normal.         Behavior: Behavior normal.       [place] Pain Rating Scale     On a scale of 0-10, during the past 24 hours, pain has interfered with you usual activity:       On a scale of 0-10, during the past 24 hours, pain has interfered with your sleep:      On a  scale of 0-10, during the past 24 hours, pain has affected your mood:       On a scale of 0-10, during the past 24 hours, pain has contributed to your stress:       On a scale of 0-10, what is your overall pain Rating: 3        Medications Prior to Admission       Prescriptions    calcium citrate-vitamin D3 (CITRACAL) 200 mg-6.25 mcg (250 unit) Oral Tablet    Take by mouth Once a day    cyclobenzaprine (FLEXERIL) 10 mg Oral Tablet    Take 1 Tablet (10 mg total) by mouth Three times a day as needed for Muscle spasms    dicyclomine (BENTYL) 20 mg Oral Tablet    Take 1 Tablet (20 mg total) by mouth Four times a day for 10 days    famotidine (PEPCID) 40 mg Oral Tablet    Take 1 Tablet (40 mg total) by mouth Twice daily for 14 days    levETIRAcetam (KEPPRA) 1,000 mg Oral Tablet    Take 1 Tablet (1,000 mg total) by mouth Twice daily    LORazepam (ATIVAN) 0.5 mg Oral Tablet    Take 1 Tablet (0.5 mg total) by mouth Once per day as needed for Anxiety    Lovastatin (MEVACOR) 10 mg Oral Tablet    Take 1 Tablet (10 mg total) by mouth Every evening Take with evening meal    naproxen (NAPROSYN) 500 mg Oral Tablet    Take 1 Tablet (500 mg total) by mouth Twice daily with food    ondansetron (ZOFRAN ODT) 4 mg Oral Tablet, Rapid Dissolve    Take 1 Tablet (4 mg total) by mouth Every 8 hours as needed for Nausea/Vomiting    pantoprazole (PROTONIX) 20 mg Oral Tablet, Delayed Release (E.C.)    Take 1 Tablet (20 mg total) by mouth Every morning before breakfast    traZODone (DESYREL) 50 mg Oral Tablet    Take 1 Tablet (50 mg total) by mouth Every night    vitamin B complex Oral Tablet    Take 1 Tablet by mouth Once a day  Allergies: Allergies[2]    Above history reviewed with patient.  Allergies, medication list, reviewed.     Patient Data   Labs Ordered/Reviewed   COVID-19, FLU A/B, RSV RAPID BY PCR - Normal    Narrative:     Results are for the simultaneous qualitative identification of SARS-CoV-2 (formerly 2019-nCoV),  Influenza A, Influenza B, and RSV RNA. These etiologic agents are generally detectable in nasopharyngeal and nasal swabs during the ACUTE PHASE of infection. Hence, this test is intended to be performed on respiratory specimens collected from individuals with signs and symptoms of upper respiratory tract infection who meet Centers for Disease Control and Prevention (CDC) clinical and/or epidemiological criteria for Coronavirus Disease 2019 (COVID-19) testing. CDC COVID-19 criteria for testing on human specimens is available at [facility] webpage information for Healthcare Professionals: Coronavirus Disease 2019 (COVID-19) ([employer]com.au).     False-negative results may occur if the virus has genomic mutations, insertions, deletions, or rearrangements or if performed very early in the course of illness. Otherwise, negative results indicate virus specific RNA targets are not detected, however negative results do not preclude SARS-CoV-2 infection/COVID-19, Influenza, or Respiratory syncytial virus infection. Results should not be used as the sole basis for patient management decisions. Negative results must be combined with clinical observations, patient history, and epidemiological information. If upper respiratory tract infection is still suspected based on exposure history together with other clinical findings, re-testing should be considered.    Test methodology:   Cepheid Xpert Xpress SARS-CoV-2/Flu/RSV Assay real-time polymerase chain reaction (RT-PCR) test on the GeneXpert Dx and Xpert Xpress systems.     No orders to display     Medical Decision Making        Medical Decision Making  Nursing notes reviewed.    Medical chart and diagnostic results reviewed.    Attending available for questioning consult.    Emergency department course:     48 year old male here for respiratory symptoms and upper respiratory infection    Exam consistent with an upper respiratory  infection    CHIEF COMPLAINT:    Cough and fever    PATIENT SUMMARY:    The patient presented with cough and fever.    Differential diagnosis includes but is not limited to  1. Viral Upper Respiratory Infection: This was considered the most likely diagnosis given the symptoms of sore throat, cough, fever, and congestion, accompanied by a negative COVID-19 test. The clinician noted that the symptoms and examination findings were consistent with a viral illness without evidence of bacterial infection or pneumonia.    2. Influenza: This was considered due to the presence of fever and respiratory symptoms; however, the negative test result reduced its likelihood. The clinician mentioned that low viral load could result in a false negative test.    3. Dehydration: The dark urine and the patient's reported low fluid intake suggested dehydration, which was likely contributing to the patient's symptoms of chills and fever.    Patient underwent diagnostic testing listed in the ED course, COVID flu RSV undetectable, patient was given treatment of Tylenol , Toradol, steroids, Tessalon Perles in the emergency room he is doing quite well states that he feels much better.  Educated on home care importance of medications follow up with primary care stay well hydrated verbalized understanding tolerated food and fluids in the emergency room.  No other emergent symptoms to address at this time.    I have discussed and counseled the patient and available family on the patient's condition, test results, treatment,  and likely expected clinical course.  Patient was counseled on supportive care at home.  All questions and concerns were addressed at the bedside.  Patient verbalized understanding and is agreeable with the plan.  Patient should return to the ER should new or worsening symptoms occur.  At this time,  patient is safe and stable for discharge home with strict return instructions.    Risk  OTC drugs.  Prescription drug  management.      ED Course as of 05/11/24 1705   Sun May 10, 2024   2205 COVID-19, FLU A/B, RSV RAPID BY PCR   2205 SARS CORONAVIRUS 2 (SARS-CoV-2): Not Detected   2205 INFLUENZA VIRUS TYPE A: Not Detected   2205 INFLUENZA VIRUS TYPE B: Not Detected   2205 RSV PCR: Not Detected   2224 COVID-19, FLU A/B, RSV RAPID BY PCR   2224 SARS CORONAVIRUS 2 (SARS-CoV-2): Not Detected   2224 INFLUENZA VIRUS TYPE A: Not Detected   2224 INFLUENZA VIRUS TYPE B: Not Detected   2224 RSV PCR: Not Detected        Medications Ordered/Administered in the ED   acetaminophen  (TYLENOL ) tablet (975 mg Oral Given 05/10/24 2103)   ketorolac (TORADOL) 60mg /2 mL IM injection (60 mg IntraMUSCULAR Given 05/10/24 2105)   dexAMETHasone (PF) 10 mg/mL injection (10 mg IntraMUSCULAR Given 05/10/24 2106)   benzonatate (TESSALON) capsule (200 mg Oral Given 05/10/24 2103)   guaiFENesin 100mg  per 5mL oral liquid - for cough (expectorant) (400 mg Oral Given 05/10/24 2105)     Clinical Impression   Viral respiratory illness (Primary)       Disposition: Discharged                  [1]   Social History  Tobacco Use    Smoking status: Never    Smokeless tobacco: Never   Vaping Use    Vaping status: Never Used   Substance Use Topics    Alcohol use: Not Currently     Comment: pt states he drinks 1 shot of whiskey after work - 3 times a month    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
